# Patient Record
Sex: Male | Born: 1970 | Race: White | Hispanic: No | Marital: Married | Smoking: Never smoker
Health system: Southern US, Community
[De-identification: ages and names within clinical notes are randomized; demographics above are authoritative.]

## PROBLEM LIST (undated history)

## (undated) DIAGNOSIS — S060XAA Concussion with loss of consciousness status unknown, initial encounter: Secondary | ICD-10-CM

## (undated) DIAGNOSIS — R441 Visual hallucinations: Secondary | ICD-10-CM

## (undated) DIAGNOSIS — J45909 Unspecified asthma, uncomplicated: Secondary | ICD-10-CM

## (undated) DIAGNOSIS — F419 Anxiety disorder, unspecified: Secondary | ICD-10-CM

## (undated) DIAGNOSIS — H8109 Meniere's disease, unspecified ear: Secondary | ICD-10-CM

## (undated) HISTORY — DX: Visual hallucinations: R44.1

## (undated) HISTORY — DX: Anxiety disorder, unspecified: F41.9

## (undated) HISTORY — DX: Unspecified asthma, uncomplicated: J45.909

---

## 2008-08-13 ENCOUNTER — Encounter: Admission: RE | Admit: 2008-08-13 | Discharge: 2008-08-13 | Payer: Self-pay | Admitting: Family Medicine
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
## 2014-12-27 ENCOUNTER — Other Ambulatory Visit: Payer: Self-pay | Admitting: Family Medicine

## 2014-12-27 ENCOUNTER — Ambulatory Visit
Admission: RE | Admit: 2014-12-27 | Discharge: 2014-12-27 | Disposition: A | Discharge status: Home / Self Care | Payer: BLUE CROSS/BLUE SHIELD | Source: Ambulatory Visit | Attending: Family Medicine | Admitting: Family Medicine

## 2014-12-27 DIAGNOSIS — R519 Headache, unspecified: Secondary | ICD-10-CM

## 2014-12-27 DIAGNOSIS — R51 Headache: Principal | ICD-10-CM
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
## 2018-04-16 ENCOUNTER — Emergency Department (HOSPITAL_COMMUNITY)
Admission: EM | Admit: 2018-04-16 | Discharge: 2018-04-16 | Disposition: A | Discharge status: Other Acute Inpatient Hospital | Payer: BLUE CROSS/BLUE SHIELD

## 2019-11-18 NOTE — Progress Notes (Signed)
Patient not seen.  Did not respond to multiple attempts to connect.

## 2019-11-19 ENCOUNTER — Other Ambulatory Visit: Payer: Self-pay

## 2019-11-19 ENCOUNTER — Telehealth (INDEPENDENT_AMBULATORY_CARE_PROVIDER_SITE_OTHER): Payer: Managed Care, Other (non HMO) | Admitting: Neurology

## 2022-02-28 ENCOUNTER — Emergency Department (HOSPITAL_COMMUNITY)
Admission: EM | Admit: 2022-02-28 | Discharge: 2022-03-01 | Discharge status: Against Medical Advice | Payer: Managed Care, Other (non HMO) | Attending: Emergency Medicine | Admitting: Emergency Medicine

## 2022-02-28 ENCOUNTER — Emergency Department (HOSPITAL_COMMUNITY): Payer: Managed Care, Other (non HMO)

## 2022-02-28 ENCOUNTER — Encounter (HOSPITAL_COMMUNITY): Payer: Self-pay | Admitting: Emergency Medicine

## 2022-02-28 ENCOUNTER — Other Ambulatory Visit: Payer: Self-pay

## 2022-02-28 DIAGNOSIS — S0990XA Unspecified injury of head, initial encounter: Secondary | ICD-10-CM | POA: Insufficient documentation

## 2022-02-28 DIAGNOSIS — W2105XA Struck by basketball, initial encounter: Secondary | ICD-10-CM | POA: Diagnosis not present

## 2022-02-28 DIAGNOSIS — Y92007 Garden or yard of unspecified non-institutional (private) residence as the place of occurrence of the external cause: Secondary | ICD-10-CM | POA: Diagnosis not present

## 2022-02-28 DIAGNOSIS — Z5321 Procedure and treatment not carried out due to patient leaving prior to being seen by health care provider: Secondary | ICD-10-CM | POA: Insufficient documentation

## 2022-02-28 DIAGNOSIS — Y9367 Activity, basketball: Secondary | ICD-10-CM | POA: Insufficient documentation

## 2022-02-28 LAB — I-STAT VENOUS BLOOD GAS, ED
Acid-Base Excess: 1 mmol/L (ref 0.0–2.0)
Bicarbonate: 24.3 mmol/L (ref 20.0–28.0)
Calcium, Ion: 1.1 mmol/L — ABNORMAL LOW (ref 1.15–1.40)
HCT: 42 % (ref 39.0–52.0)
Hemoglobin: 14.3 g/dL (ref 13.0–17.0)
O2 Saturation: 86 %
Potassium: 3.9 mmol/L (ref 3.5–5.1)
Sodium: 137 mmol/L (ref 135–145)
TCO2: 25 mmol/L (ref 22–32)
pCO2, Ven: 35.1 mmHg — ABNORMAL LOW (ref 44–60)
pH, Ven: 7.448 — ABNORMAL HIGH (ref 7.25–7.43)
pO2, Ven: 49 mmHg — ABNORMAL HIGH (ref 32–45)
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
## 2022-03-01 LAB — CBC WITH DIFFERENTIAL/PLATELET
Abs Immature Granulocytes: 0.04 10*3/uL (ref 0.00–0.07)
Basophils Absolute: 0 10*3/uL (ref 0.0–0.1)
Basophils Relative: 0 %
Eosinophils Absolute: 0.1 10*3/uL (ref 0.0–0.5)
Eosinophils Relative: 1 %
HCT: 41.7 % (ref 39.0–52.0)
Hemoglobin: 13.9 g/dL (ref 13.0–17.0)
Immature Granulocytes: 0 %
Lymphocytes Relative: 20 %
Lymphs Abs: 2.2 10*3/uL (ref 0.7–4.0)
MCH: 30 pg (ref 26.0–34.0)
MCHC: 33.3 g/dL (ref 30.0–36.0)
MCV: 90.1 fL (ref 80.0–100.0)
Monocytes Absolute: 0.8 10*3/uL (ref 0.1–1.0)
Monocytes Relative: 7 %
Neutro Abs: 8 10*3/uL — ABNORMAL HIGH (ref 1.7–7.7)
Neutrophils Relative %: 72 %
Platelets: 175 10*3/uL (ref 150–400)
RBC: 4.63 MIL/uL (ref 4.22–5.81)
RDW: 12.8 % (ref 11.5–15.5)
WBC: 11.1 10*3/uL — ABNORMAL HIGH (ref 4.0–10.5)
nRBC: 0 % (ref 0.0–0.2)

## 2022-03-01 LAB — COMPREHENSIVE METABOLIC PANEL
ALT: 25 U/L (ref 0–44)
AST: 27 U/L (ref 15–41)
Albumin: 4.3 g/dL (ref 3.5–5.0)
Alkaline Phosphatase: 78 U/L (ref 38–126)
Anion gap: 7 (ref 5–15)
BUN: 15 mg/dL (ref 6–20)
CO2: 25 mmol/L (ref 22–32)
Calcium: 9.3 mg/dL (ref 8.9–10.3)
Chloride: 104 mmol/L (ref 98–111)
Creatinine, Ser: 1.24 mg/dL (ref 0.61–1.24)
GFR, Estimated: >60 mL/min (ref >60)
Glucose, Bld: 98 mg/dL (ref 70–99)
Potassium: 3.9 mmol/L (ref 3.5–5.1)
Sodium: 136 mmol/L (ref 135–145)
Total Bilirubin: 0.6 mg/dL (ref 0.3–1.2)
Total Protein: 6.9 g/dL (ref 6.5–8.1)

## 2022-03-01 LAB — ETHANOL: Alcohol, Ethyl (B): <10 mg/dL (ref <10)

## 2022-03-01 LAB — AMMONIA: Ammonia: 24 umol/L (ref 9–35)

## 2022-03-01 NOTE — ED Notes (Signed)
Pt left. Pt was advised to stay.  ?

## 2022-04-01 ENCOUNTER — Other Ambulatory Visit: Payer: Self-pay | Admitting: Sports Medicine

## 2022-04-02 ENCOUNTER — Other Ambulatory Visit: Payer: Self-pay | Admitting: Sports Medicine

## 2022-04-02 DIAGNOSIS — S0990XA Unspecified injury of head, initial encounter: Secondary | ICD-10-CM

## 2023-04-23 ENCOUNTER — Emergency Department (HOSPITAL_BASED_OUTPATIENT_CLINIC_OR_DEPARTMENT_OTHER)
Admission: EM | Admit: 2023-04-23 | Discharge: 2023-04-24 | Disposition: A | Discharge status: Home / Self Care | Payer: Managed Care, Other (non HMO) | Attending: Emergency Medicine | Admitting: Emergency Medicine

## 2023-04-23 ENCOUNTER — Other Ambulatory Visit: Payer: Self-pay

## 2023-04-23 ENCOUNTER — Emergency Department (HOSPITAL_BASED_OUTPATIENT_CLINIC_OR_DEPARTMENT_OTHER): Payer: Managed Care, Other (non HMO)

## 2023-04-23 ENCOUNTER — Encounter (HOSPITAL_BASED_OUTPATIENT_CLINIC_OR_DEPARTMENT_OTHER): Payer: Self-pay | Admitting: *Deleted

## 2023-04-23 DIAGNOSIS — Y92410 Unspecified street and highway as the place of occurrence of the external cause: Secondary | ICD-10-CM | POA: Diagnosis not present

## 2023-04-23 DIAGNOSIS — S0993XA Unspecified injury of face, initial encounter: Secondary | ICD-10-CM | POA: Diagnosis present

## 2023-04-23 DIAGNOSIS — M542 Cervicalgia: Secondary | ICD-10-CM | POA: Insufficient documentation

## 2023-04-23 DIAGNOSIS — Z23 Encounter for immunization: Secondary | ICD-10-CM | POA: Diagnosis not present

## 2023-04-23 DIAGNOSIS — S022XXA Fracture of nasal bones, initial encounter for closed fracture: Secondary | ICD-10-CM | POA: Diagnosis not present

## 2023-04-23 DIAGNOSIS — R519 Headache, unspecified: Secondary | ICD-10-CM | POA: Diagnosis not present

## 2023-04-23 HISTORY — DX: Meniere's disease, unspecified ear: H81.09

## 2023-04-23 HISTORY — DX: Concussion with loss of consciousness status unknown, initial encounter: S06.0XAA

## 2023-04-23 LAB — CBC WITH DIFFERENTIAL/PLATELET
Abs Immature Granulocytes: 0.03 10*3/uL (ref 0.00–0.07)
Basophils Absolute: 0 10*3/uL (ref 0.0–0.1)
Basophils Relative: 0 %
Eosinophils Absolute: 0.1 10*3/uL (ref 0.0–0.5)
Eosinophils Relative: 2 %
HCT: 41.8 % (ref 39.0–52.0)
Hemoglobin: 14.3 g/dL (ref 13.0–17.0)
Immature Granulocytes: 0 %
Lymphocytes Relative: 26 %
Lymphs Abs: 2.2 10*3/uL (ref 0.7–4.0)
MCH: 30.1 pg (ref 26.0–34.0)
MCHC: 34.2 g/dL (ref 30.0–36.0)
MCV: 88 fL (ref 80.0–100.0)
Monocytes Absolute: 0.8 10*3/uL (ref 0.1–1.0)
Monocytes Relative: 9 %
Neutro Abs: 5.2 10*3/uL (ref 1.7–7.7)
Neutrophils Relative %: 63 %
Platelets: 175 10*3/uL (ref 150–400)
RBC: 4.75 MIL/uL (ref 4.22–5.81)
RDW: 12.6 % (ref 11.5–15.5)
WBC: 8.4 10*3/uL (ref 4.0–10.5)
nRBC: 0 % (ref 0.0–0.2)

## 2023-04-23 LAB — BASIC METABOLIC PANEL
Anion gap: 8 (ref 5–15)
BUN: 18 mg/dL (ref 6–20)
CO2: 26 mmol/L (ref 22–32)
Calcium: 9.5 mg/dL (ref 8.9–10.3)
Chloride: 101 mmol/L (ref 98–111)
Creatinine, Ser: 1.14 mg/dL (ref 0.61–1.24)
GFR, Estimated: >60 mL/min (ref >60)
Glucose, Bld: 90 mg/dL (ref 70–99)
Potassium: 4.1 mmol/L (ref 3.5–5.1)
Sodium: 135 mmol/L (ref 135–145)

## 2023-04-23 MED ORDER — HYDROMORPHONE HCL 1 MG/ML IJ SOLN
1.0000 mg | Freq: Once | INTRAMUSCULAR | Status: AC
  Administered 2023-04-23: 1 mg via INTRAVENOUS
  Filled 2023-04-23: qty 1
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
## 2023-04-24 MED ORDER — TETANUS-DIPHTH-ACELL PERTUSSIS 5-2.5-18.5 LF-MCG/0.5 IM SUSY
0.5000 mL | PREFILLED_SYRINGE | Freq: Once | INTRAMUSCULAR | Status: AC
  Administered 2023-04-24: 0.5 mL via INTRAMUSCULAR
  Filled 2023-04-24: qty 0.5
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
## 2023-05-02 HISTORY — PX: NOSE SURGERY: SHX723

## 2024-09-06 ENCOUNTER — Ambulatory Visit: Payer: Self-pay | Admitting: Family

## 2024-09-06 ENCOUNTER — Encounter: Payer: Self-pay | Admitting: Family

## 2024-09-06 VITALS — BP 124/76 | HR 72 | Temp 98.1°F | Resp 20 | Ht 76.0 in | Wt 233.8 lb

## 2024-09-06 DIAGNOSIS — J4520 Mild intermittent asthma, uncomplicated: Secondary | ICD-10-CM

## 2024-09-06 DIAGNOSIS — Z1159 Encounter for screening for other viral diseases: Secondary | ICD-10-CM

## 2024-09-06 DIAGNOSIS — J302 Other seasonal allergic rhinitis: Secondary | ICD-10-CM

## 2024-09-06 DIAGNOSIS — F5104 Psychophysiologic insomnia: Secondary | ICD-10-CM

## 2024-09-06 DIAGNOSIS — Z13220 Encounter for screening for lipoid disorders: Secondary | ICD-10-CM | POA: Diagnosis not present

## 2024-09-06 DIAGNOSIS — F411 Generalized anxiety disorder: Secondary | ICD-10-CM | POA: Diagnosis not present

## 2024-09-06 DIAGNOSIS — Z113 Encounter for screening for infections with a predominantly sexual mode of transmission: Secondary | ICD-10-CM

## 2024-09-06 DIAGNOSIS — Z7689 Persons encountering health services in other specified circumstances: Secondary | ICD-10-CM | POA: Diagnosis not present

## 2024-09-06 DIAGNOSIS — H8103 Meniere's disease, bilateral: Secondary | ICD-10-CM

## 2024-09-06 DIAGNOSIS — R443 Hallucinations, unspecified: Secondary | ICD-10-CM

## 2024-09-06 MED ORDER — TRAZODONE HCL 50 MG PO TABS: 25.0000 mg | ORAL_TABLET | Freq: Every evening | ORAL | 3 refills | Status: AC | PRN

## 2024-09-06 MED ORDER — HYDROXYZINE HCL 25 MG PO TABS: 25.0000 mg | ORAL_TABLET | Freq: Three times a day (TID) | ORAL | 1 refills | Status: AC | PRN

## 2024-09-16 ENCOUNTER — Encounter: Payer: Self-pay | Admitting: Family

## 2024-09-16 DIAGNOSIS — F5104 Psychophysiologic insomnia: Secondary | ICD-10-CM | POA: Insufficient documentation

## 2024-09-16 DIAGNOSIS — H8103 Meniere's disease, bilateral: Secondary | ICD-10-CM | POA: Insufficient documentation

## 2024-09-16 DIAGNOSIS — F411 Generalized anxiety disorder: Secondary | ICD-10-CM | POA: Insufficient documentation

## 2024-09-16 DIAGNOSIS — R443 Hallucinations, unspecified: Secondary | ICD-10-CM | POA: Insufficient documentation

## 2024-09-16 DIAGNOSIS — J4520 Mild intermittent asthma, uncomplicated: Secondary | ICD-10-CM | POA: Insufficient documentation

## 2024-09-16 DIAGNOSIS — J302 Other seasonal allergic rhinitis: Secondary | ICD-10-CM | POA: Insufficient documentation

## 2024-09-16 NOTE — Progress Notes (Signed)
 Provider: Roxan Plough FNP-C   Stonewall Doss, Roxan BROCKS, NP  Patient Care Team: Azaylia Fong, Roxan BROCKS, NP as PCP - General (Family Medicine)  Extended Emergency Contact Information Primary Emergency Contact: Thorson,Stacey Address: [address]          [place] [address] [place] Home Phone: [phone] Relation: Spouse  Code Status: Full code Goals of care: Advanced Directive information    09/06/2024    3:15 PM  Advanced Directives  Does Patient Have a Medical Advance Directive? Yes  Type of Advance Directive Living will  Does patient want to make changes to medical advance directive? No - Patient declined     Chief Complaint  Patient presents with   Establish Care   Sleep Concerns     Discussed the use of AI scribe software for clinical note transcription with the patient, who gave verbal consent to proceed.  History of Present Illness   Brian Castro is a 53 year old male who presents to establish care he complains of insomnia and anxiety.  He has been experiencing significant insomnia and anxiety for the past two to three years. His sleep pattern averages four hours per night, followed by a period of sleeping for twelve to fourteen hours after several days of inadequate sleep. He reports that he attributes his insomnia primarily to anxiety and believes that his previous concussions have contributed to his symptoms. He has had three concussions over the past seven years, which he feels have contributed to depression and anxiety cycles.  He has attempted various over-the-counter remedies for sleep, including magnesium supplements, melatonin, and diphenhydramine (Z-Quil). While these initially provided some relief, they eventually led to vivid nightmares and hallucinations, such as seeing his deceased grandmother banging on the windows and imagining rats in the bed. He has not used any prescription medications for anxiety or depression and has not seen a  therapist or psychiatrist.  No history of sleep apnea, though he has started snoring lightly in the past five years. He has a history of asthma, primarily triggered by allergies to animals, particularly mice and cats, but he has not used an inhaler since childhood.  His past medical history includes Meniere's disease, for which he previously took a diuretic to manage fluid accumulation in his ears, but he no longer uses this medication. He also had a concussion and a broken nose about a year and a half ago, for which he underwent surgery. He reports no current pain or significant breathing issues, although he notes some restriction in nasal breathing.  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 His diet is predominantly healthy, consisting of lean proteins and vegetables, although he occasionally consumes fast food.    Past Medical History:  Diagnosis Date   Anxiety    Asthma    Concussion    Hallucination, visual    Meniere's disease    Past Surgical History:  Procedure Laterality Date   NOSE SURGERY  05/02/2023    Allergies  Allergen Reactions   Horse-Derived Products Anaphylaxis   Little Animals [Animal Chews] Swelling    Mice/Wheezing occurs.   Bee Venom Swelling   Cat Dander Other (See Comments)    Sneezing and wheezing   Soybean-Containing Drug Products Nausea Only    Allergies as of 09/06/2024       Reactions   Horse-derived Products Anaphylaxis   Little Animals [animal Chews] Swelling   Mice/Wheezing occurs.   Bee Venom Swelling  Cat Dander Other (See Comments)   Sneezing and wheezing   Soybean-containing Drug Products Nausea Only        Medication List        Accurate as of September 06, 2024 11:59 PM. If you have any questions, ask your nurse or doctor.          STOP taking these medications    triamterene-hydrochlorothiazide 37.5-25 MG capsule Commonly known as: DYAZIDE Stopped by: Coreen Shippee C  Charyl Minervini       TAKE these medications    acetaminophen 500 MG tablet Commonly known as: TYLENOL Take 500 mg by mouth as needed.   cetirizine 10 MG tablet Commonly known as: ZYRTEC Take 10 mg by mouth daily.   hydrOXYzine 25 MG tablet Commonly known as: ATARAX Take 1 tablet (25 mg total) by mouth 3 (three) times daily as needed for anxiety. Started by: Deshane Cotroneo C Janne Faulk   loratadine 10 MG tablet Commonly known as: CLARITIN Take 10 mg by mouth.   traZODone 50 MG tablet Commonly known as: DESYREL Take 0.5-1 tablets (25-50 mg total) by mouth at bedtime as needed for sleep. Started by: Roxan BROCKS Brenlyn Beshara        Review of Systems  Constitutional:  Negative for appetite change, chills, fatigue, fever and unexpected weight change.  HENT:  Negative for congestion, dental problem, ear discharge, ear pain, facial swelling, hearing loss, nosebleeds, postnasal drip, rhinorrhea, sinus pressure, sinus pain, sneezing, sore throat, tinnitus and trouble swallowing.   Eyes:  Negative for pain, discharge, redness, itching and visual disturbance.  Respiratory:  Negative for cough, chest tightness, shortness of breath and wheezing.   Cardiovascular:  Negative for chest pain, palpitations and leg swelling.  Gastrointestinal:  Negative for abdominal distention, abdominal pain, constipation, diarrhea, nausea and vomiting.  Endocrine: Negative for cold intolerance, heat intolerance, polydipsia, polyphagia and polyuria.  Genitourinary:  Negative for difficulty urinating, dysuria, flank pain, frequency and urgency.  Musculoskeletal:  Negative for arthralgias, back pain, gait problem, joint swelling, myalgias, neck pain and neck stiffness.  Skin:  Negative for color change, pallor, rash and wound.  Neurological:  Negative for dizziness, syncope, speech difficulty, weakness, light-headedness, numbness and headaches.  Hematological:  Does not bruise/bleed easily.  Psychiatric/Behavioral:  Positive for  hallucinations and sleep disturbance. Negative for agitation, behavioral problems, confusion, self-injury and suicidal ideas. The patient is nervous/anxious.     Immunization History  Administered Date(s) Administered   Tdap 04/24/2023   Unspecified SARS-COV-2 Vaccination 02/18/2020   Pertinent  Health Maintenance Due  Topic Date Due   Colonoscopy  Never done   Influenza Vaccine  02/05/2025 (Originally 06/08/2024)      02/28/2022   11:08 PM 09/06/2024    3:15 PM  Fall Risk  Falls in the past year?  0  Was there an injury with Fall?  0  Fall Risk Category Calculator  0  (RETIRED) Patient Fall Risk Level Low fall risk    Patient at Risk for Falls Due to  No Fall Risks  Fall risk Follow up  Falls evaluation completed     Data saved with a previous flowsheet row definition   Functional Status Survey:    Vitals:   09/06/24 1522  BP: 124/76  Pulse: 72  Resp: 20  Temp: 98.1 F (36.7 C)  SpO2: 97%  Weight: 233 lb 12.8 oz (106.1 kg)  Height: 6' 4 (1.93 m)   Body mass index is 28.46 kg/m. Physical Exam GENERAL: Alert, cooperative, well developed, no acute distress.  HEENT: Normocephalic, normal oropharynx, moist mucous membranes, ears normal, tympanic membranes normal, nose normal, no sinus tenderness. NECK: Supple, no tenderness. CHEST: Clear to auscultation bilaterally, no wheezes, rhonchi, or crackles. CARDIOVASCULAR: Normal heart rate and rhythm, S1 and S2 normal without murmurs. ABDOMEN: Soft, non-tender, non-distended, without organomegaly, normal bowel sounds. EXTREMITIES: No cyanosis or edema. MUSCULOSKELETAL: Normal range of motion, no pain. NEUROLOGICAL: Cranial nerves II-XII grossly intact, moves all extremities without gross motor or sensory deficit, normal finger-to-nose test, facial sensation and movement normal.  SKIN: No rash,no lesion or erythema   PSYCHIATRY/BEHAVIORAL: Anxious   Labs reviewed: No results for input(s): NA, K, CL, CO2, GLUCOSE,  BUN, CREATININE, CALCIUM, MG, PHOS in the last 8760 hours. No results for input(s): AST, ALT, ALKPHOS, BILITOT, PROT, ALBUMIN in the last 8760 hours. No results for input(s): WBC, NEUTROABS, HGB, HCT, MCV, PLT in the last 8760 hours. No results found for: TSH No results found for: HGBA1C No results found for: CHOL, HDL, LDLCALC, LDLDIRECT, TRIG, CHOLHDL  Significant Diagnostic Results in last 30 days:  No results found.  Assessment/Plan  Meniere's disease Previously managed with medication for fluid retention, which is no longer taken.  Insomnia associated with anxiety and depression Chronic insomnia with inconsistent sleep patterns, averaging four hours per night with occasional extended sleep periods. Anxiety and depression, exacerbated by multiple concussions, contribute to insomnia. Previous use of over-the-counter sleep aids like magnesium, melatonin, and diphenhydramine was ineffective and caused nightmares. No history of sleep apnea. - Prescribed trazodone 25 mg for sleep, with the option to increase to 50 mg if needed. - Prescribed hydroxyzine for anxiety, to be taken as needed, up to three times a day. - Referred to behavioral psychiatry for therapy and medication management.  Anxiety disorder Anxiety exacerbated by multiple concussions and contributing to insomnia. No prior treatment for anxiety. Hydroxyzine chosen for its non-sedative properties and as-needed use. - Prescribed hydroxyzine for anxiety, to be taken as needed, up to three times a day.  Depression Cycles triggered by concussions, contributing to anxiety and insomnia. No prior treatment for depression. Behavioral psychiatry referral includes therapy options. - Referred to behavioral psychiatry for therapy and medication management.  History of concussion Multiple concussions over the past five to seven years, contributing to anxiety and depression. Concerns about  cumulative effects of concussions. - Referred to neurology for concussion monitoring.  Allergic rhinitis Managed with cetirizine and loratadine. Symptoms triggered by exposure to mice and cats. - Continue cetirizine and loratadine for allergy management.  Asthma Infrequent symptoms, primarily triggered by allergies. No recent use of inhaler since childhood. - Continue current allergy management with cetirizine and loratadine.  General Health Maintenance Discussion of diet and exercise habits. Emphasis on healthy eating and regular physical activity. No current use of flu or shingles vaccines. Discussion of COVID-19 vaccination preference. - Encouraged a diet low in cholesterol and high in vegetables and lean proteins. - Discussed COVID-19 vaccination preference for Pfizer. - Discussed potential future shingles vaccination.   Family/ staff Communication: Reviewed plan of care with patient verbalized understanding  Labs/tests ordered:  - CBC with Differential/Platelet - CMP with eGFR([facility]) - TSH - Hgb A1C - Lipid panel   Next Appointment : Return in about 1 month (around 10/07/2024) for Generalized anxiety disorder, fasting labs in one week.   Spent 45 minutes of Face to face and non-face to face with patient  >50% time spent counseling; reviewing medical record; tests; labs; documentation and developing future plan of care.   Delmas Faucett C  Alireza Pollack, NP

## 2024-10-09 ENCOUNTER — Encounter: Payer: Self-pay | Admitting: Family

## 2024-10-09 ENCOUNTER — Ambulatory Visit: Admitting: Family

## 2024-10-09 VITALS — BP 125/80 | HR 85 | Temp 97.8°F | Ht 76.0 in | Wt 233.0 lb

## 2024-10-09 DIAGNOSIS — Z1211 Encounter for screening for malignant neoplasm of colon: Secondary | ICD-10-CM | POA: Diagnosis not present

## 2024-10-09 DIAGNOSIS — R5383 Other fatigue: Secondary | ICD-10-CM

## 2024-10-09 DIAGNOSIS — L989 Disorder of the skin and subcutaneous tissue, unspecified: Secondary | ICD-10-CM

## 2024-10-09 DIAGNOSIS — F411 Generalized anxiety disorder: Secondary | ICD-10-CM

## 2024-10-09 DIAGNOSIS — F5104 Psychophysiologic insomnia: Secondary | ICD-10-CM

## 2024-10-09 NOTE — Progress Notes (Signed)
 Provider: Roxan Plough FNP-C   Brax Walen, Roxan BROCKS, NP  Patient Care Team: Duval Macleod, Roxan BROCKS, NP as PCP - General (Family Medicine)  Extended Emergency Contact Information Primary Emergency Contact: Welcome,Stacey Address: [address]          [place] [address] [place] Home Phone: [phone] Relation: Spouse  Code Status:  Full Code  Goals of care: Advanced Directive information    09/06/2024    3:15 PM  Advanced Directives  Does Patient Have a Medical Advance Directive? Yes  Type of Advance Directive Living will  Does patient want to make changes to medical advance directive? No - Patient declined     Chief Complaint  Patient presents with   Medical Management of Chronic Issues    1 Month follow up.    Discussed the use of AI scribe software for clinical note transcription with the patient, who gave verbal consent to proceed.  History of Present Illness   Brian Castro is a 53 year old male who presents for a one-month follow-up for anxiety.  He has not taken trazodone , but knowing it is available has improved his mental health and sleep. He feels reassured having trazodone  and hydroxyzine  available if needed, contributing to better sleep quality. He has not yet been contacted by behavioral psychiatry for therapy.  He is seeking a referral to a dermatologist for a skin checkup due to several moles on his cheek, back, and neck, with the cheek mole having been accidentally shaved off multiple times.  He is considering scheduling a colonoscopy due to a family history of intestinal cancer, as his mother has developed this condition. He is concerned about scheduling the procedure due to his sleep pattern, as he typically stays up until 4 AM and would find early morning appointments challenging.  He is interested in having his testosterone levels checked, citing symptoms such as low energy and brain fog. He has not been previously diagnosed with low  testosterone and has not yet had his blood drawn for this test due to past negative experiences with blood draws.    Past Medical History:  Diagnosis Date   Anxiety    Asthma    Concussion    Hallucination, visual    Meniere's disease    Past Surgical History:  Procedure Laterality Date   NOSE SURGERY  05/02/2023    Allergies  Allergen Reactions   Horse-Derived Products Anaphylaxis   Little Animals [Animal Chews] Swelling    Mice/Wheezing occurs.   Bee Venom Swelling   Cat Dander Other (See Comments)    Sneezing and wheezing   Soybean-Containing Drug Products Nausea Only    Allergies as of 10/09/2024       Reactions   Horse-derived Products Anaphylaxis   Little Animals [animal Chews] Swelling   Mice/Wheezing occurs.   Bee Venom Swelling   Cat Dander Other (See Comments)   Sneezing and wheezing   Soybean-containing Drug Products Nausea Only        Medication List        Accurate as of October 09, 2024  4:20 PM. If you have any questions, ask your nurse or doctor.          acetaminophen 500 MG tablet Commonly known as: TYLENOL Take 500 mg by mouth as needed.   cetirizine 10 MG tablet Commonly known as: ZYRTEC Take 10 mg by mouth daily.   hydrOXYzine  25 MG tablet Commonly known as: ATARAX  Take 1 tablet (25 mg total)  by mouth 3 (three) times daily as needed for anxiety.   loratadine 10 MG tablet Commonly known as: CLARITIN Take 10 mg by mouth.   traZODone  50 MG tablet Commonly known as: DESYREL  Take 0.5-1 tablets (25-50 mg total) by mouth at bedtime as needed for sleep.        Review of Systems  Constitutional:  Positive for fatigue. Negative for appetite change, chills, fever and unexpected weight change.        Brain fog    HENT:  Negative for congestion, dental problem, ear discharge, ear pain, hearing loss, nosebleeds, postnasal drip, rhinorrhea, sinus pressure, sinus pain, sneezing, sore throat, tinnitus and trouble swallowing.   Eyes:   Negative for pain, discharge, redness, itching and visual disturbance.  Respiratory:  Negative for cough, chest tightness, shortness of breath and wheezing.   Cardiovascular:  Negative for chest pain, palpitations and leg swelling.  Gastrointestinal:  Negative for abdominal distention, abdominal pain, blood in stool, constipation, diarrhea, nausea and vomiting.  Endocrine: Negative for cold intolerance, heat intolerance, polydipsia, polyphagia and polyuria.  Genitourinary:  Negative for difficulty urinating, dysuria, flank pain, frequency and urgency.  Musculoskeletal:  Negative for arthralgias, back pain, gait problem, joint swelling, myalgias, neck pain and neck stiffness.  Skin:  Negative for color change, pallor, rash and wound.       Skin lesion on upper back,neck and face   Neurological:  Negative for dizziness, syncope, speech difficulty, weakness, light-headedness, numbness and headaches.  Hematological:  Does not bruise/bleed easily.  Psychiatric/Behavioral:  Positive for sleep disturbance. Negative for agitation, behavioral problems, confusion, hallucinations, self-injury and suicidal ideas. The patient is nervous/anxious.     Immunization History  Administered Date(s) Administered   Tdap 04/24/2023   Unspecified SARS-COV-2 Vaccination 02/18/2020   Pertinent  Health Maintenance Due  Topic Date Due   Colonoscopy  Never done   Influenza Vaccine  02/05/2025 (Originally 06/08/2024)      02/28/2022   11:08 PM 09/06/2024    3:15 PM  Fall Risk  Falls in the past year?  0  Was there an injury with Fall?  0   Fall Risk Category Calculator  0  (RETIRED) Patient Fall Risk Level Low fall risk    Patient at Risk for Falls Due to  No Fall Risks  Fall risk Follow up  Falls evaluation completed     Data saved with a previous flowsheet row definition   Functional Status Survey:    Vitals:   10/09/24 1457  BP: 125/80  Pulse: 85  Temp: 97.8 F (36.6 C)  SpO2: 99%  Weight: 233 lb  (105.7 kg)  Height: 6' 4 (1.93 m)   Body mass index is 28.36 kg/m. Physical Exam Physical Exam   VITALS: T- 97.8, P- 85, BP- 125/80, SaO2- 99% MEASUREMENTS: Weight- 233. GENERAL: Alert, cooperative, well developed, no acute distress HEENT: Normocephalic, normal oropharynx, moist mucous membranes CHEST: Clear to auscultation bilaterally, no wheezes, rhonchi, or crackles CARDIOVASCULAR: Normal heart rate and rhythm, S1 and S2 normal without murmurs ABDOMEN: Soft, non-tender, non-distended, without organomegaly, normal bowel sounds EXTREMITIES: No cyanosis or edema NEUROLOGICAL: Cranial nerves grossly intact, moves all extremities without gross motor or sensory deficit SKIN: Multiple moles: 0.5 cm raised, smooth, skin-colored mole on back; 0.8 cm flat, dark-colored mole on belly; cherry-colored benign mole on right shoulder; benign mole under beard; 0.4 cm benign mole on neck, same color as back mole   Labs reviewed: No results for input(s): NA, K, CL, CO2, GLUCOSE, BUN,  CREATININE, CALCIUM, MG, PHOS in the last 8760 hours. No results for input(s): AST, ALT, ALKPHOS, BILITOT, PROT, ALBUMIN in the last 8760 hours. No results for input(s): WBC, NEUTROABS, HGB, HCT, MCV, PLT in the last 8760 hours. No results found for: TSH No results found for: HGBA1C No results found for: CHOL, HDL, LDLCALC, LDLDIRECT, TRIG, CHOLHDL  Significant Diagnostic Results in last 30 days:  No results found.  Assessment/Plan  Generalized anxiety disorder Hydroxyzine  was prescribed for anxiety, but he has not taken the medication. He reports improved mental health and better sleep, attributing this to the availability of medication. - Continue hydroxyzine  for anxiety management - Follow up with behavioral psychiatry for therapy  Insomnia Managed with trazodone . He reports improved sleep, feeling reassured by the availability of medication. -  Continue trazodone  for sleep management  Skin lesion evaluation He has multiple moles on the face, neck, and back, with no significant changes in color or size. He requests evaluation due to age and family history of skin issues. - Referred to dermatologist for skin lesion evaluation  Fatigue He reports fatigue, brain fog, and low energy, possibly related to mental health or testosterone levels. He is interested in evaluating testosterone levels but acknowledges difficulty distinguishing symptoms from mental health issues. - Ordered testosterone level test with two blood tests on different days, performed in the morning when testosterone is at its peak  General health maintenance He is due for a colonoscopy due to family history of intestinal cancer. He expresses difficulty with scheduling due to sleep patterns. - Referred for colonoscopy screening - Advised to discuss scheduling preferences with colonoscopy center    Family/ staff Communication: Reviewed plan of care with patient verbalized understanding   Labs/tests ordered: Testosterone level   Next Appointment : Return in about 6 months (around 04/09/2025) for medical mangement of chronic issues., fasting labs in one week.   Spent 25 minutes of Face to face and non-face to face with patient  >50% time spent counseling; reviewing medical record; tests; labs; documentation and developing future plan of care.   Roxan JAYSON Plough, NP

## 2024-10-16 ENCOUNTER — Other Ambulatory Visit

## 2024-10-16 DIAGNOSIS — Z1159 Encounter for screening for other viral diseases: Secondary | ICD-10-CM

## 2024-10-16 DIAGNOSIS — Z113 Encounter for screening for infections with a predominantly sexual mode of transmission: Secondary | ICD-10-CM

## 2024-10-16 DIAGNOSIS — R5383 Other fatigue: Secondary | ICD-10-CM

## 2024-10-16 DIAGNOSIS — F411 Generalized anxiety disorder: Secondary | ICD-10-CM

## 2024-10-16 DIAGNOSIS — Z13220 Encounter for screening for lipoid disorders: Secondary | ICD-10-CM

## 2024-12-10 ENCOUNTER — Encounter: Payer: Self-pay | Admitting: Gastroenterology

## 2025-04-09 ENCOUNTER — Ambulatory Visit: Admitting: Family
